# Patient Record
Sex: Male | Born: 1965 | Race: Black or African American | Hispanic: No | Marital: Single | Smoking: Never smoker
Health system: Southern US, Community
[De-identification: ages and names within clinical notes are randomized; demographics above are authoritative.]

---

## 2009-01-06 ENCOUNTER — Emergency Department (HOSPITAL_COMMUNITY): Admission: EM | Admit: 2009-01-06 | Discharge: 2009-01-06 | Payer: Self-pay | Admitting: Family Medicine

## 2009-03-29 ENCOUNTER — Ambulatory Visit: Payer: Self-pay | Admitting: Internal Medicine

## 2009-04-20 ENCOUNTER — Ambulatory Visit: Payer: Self-pay | Admitting: Internal Medicine

## 2009-04-20 ENCOUNTER — Encounter: Payer: Self-pay | Admitting: Family Medicine

## 2009-04-20 LAB — CONVERTED CEMR LAB
ALT: 26 units/L (ref 0–53)
AST: 26 units/L (ref 0–37)
Albumin: 4.2 g/dL (ref 3.5–5.2)
Alkaline Phosphatase: 83 units/L (ref 39–117)
Basophils Absolute: 0 10*3/uL (ref 0.0–0.1)
Basophils Relative: 1 % (ref 0–1)
CO2: 22 meq/L (ref 19–32)
Chloride: 105 meq/L (ref 96–112)
Eosinophils Relative: 4 % (ref 0–5)
HDL: 48 mg/dL (ref >39)
MCV: 96.3 fL (ref 78.0–100.0)
Monocytes Relative: 10 % (ref 3–12)
Neutrophils Relative %: 32 % — ABNORMAL LOW (ref 43–77)
Platelets: 275 10*3/uL (ref 150–400)
RBC: 4.37 M/uL (ref 4.22–5.81)
RDW: 12.9 % (ref 11.5–15.5)
Sodium: 138 meq/L (ref 135–145)
Total Bilirubin: 0.5 mg/dL (ref 0.3–1.2)
Total CHOL/HDL Ratio: 3.6
VLDL: 22 mg/dL (ref 0–40)
WBC: 4.4 10*3/uL (ref 4.0–10.5)

## 2009-05-18 ENCOUNTER — Ambulatory Visit (HOSPITAL_COMMUNITY): Admission: RE | Admit: 2009-05-18 | Discharge: 2009-05-18 | Payer: Self-pay | Admitting: Internal Medicine

## 2009-07-12 ENCOUNTER — Ambulatory Visit: Payer: Self-pay | Admitting: Family Medicine

## 2009-07-20 ENCOUNTER — Ambulatory Visit: Payer: Self-pay | Admitting: Sports Medicine

## 2009-07-20 DIAGNOSIS — IMO0002 Reserved for concepts with insufficient information to code with codable children: Secondary | ICD-10-CM

## 2009-07-20 DIAGNOSIS — M25569 Pain in unspecified knee: Secondary | ICD-10-CM

## 2009-07-21 ENCOUNTER — Encounter: Payer: Self-pay | Admitting: Sports Medicine

## 2009-07-25 ENCOUNTER — Encounter: Payer: Self-pay | Admitting: Sports Medicine

## 2009-08-05 ENCOUNTER — Emergency Department (HOSPITAL_COMMUNITY): Admission: EM | Admit: 2009-08-05 | Discharge: 2009-08-05 | Payer: Self-pay | Admitting: Family Medicine

## 2010-04-19 ENCOUNTER — Emergency Department (HOSPITAL_COMMUNITY): Admission: EM | Admit: 2010-04-19 | Discharge: 2010-04-20 | Payer: Self-pay | Admitting: Emergency Medicine

## 2010-08-16 ENCOUNTER — Emergency Department (HOSPITAL_COMMUNITY)
Admission: EM | Admit: 2010-08-16 | Discharge: 2010-08-16 | Payer: Self-pay | Source: Home / Self Care | Admitting: Family Medicine

## 2010-10-08 ENCOUNTER — Encounter: Payer: Self-pay | Admitting: Internal Medicine

## 2011-09-11 ENCOUNTER — Encounter: Payer: Self-pay | Admitting: *Deleted

## 2011-09-11 NOTE — Progress Notes (Signed)
[facility] faxed Revlimid refill request.  Request to MD for review.

## 2018-10-10 ENCOUNTER — Emergency Department (HOSPITAL_COMMUNITY)
Admission: EM | Admit: 2018-10-10 | Discharge: 2018-10-11 | Disposition: A | Discharge status: Home / Self Care | Payer: No Typology Code available for payment source | Attending: Emergency Medicine | Admitting: Emergency Medicine

## 2018-10-10 DIAGNOSIS — R140 Abdominal distension (gaseous): Secondary | ICD-10-CM | POA: Diagnosis not present

## 2018-10-10 DIAGNOSIS — S299XXA Unspecified injury of thorax, initial encounter: Secondary | ICD-10-CM | POA: Diagnosis present

## 2018-10-10 DIAGNOSIS — S2220XA Unspecified fracture of sternum, initial encounter for closed fracture: Secondary | ICD-10-CM | POA: Insufficient documentation

## 2018-10-10 DIAGNOSIS — Y939 Activity, unspecified: Secondary | ICD-10-CM | POA: Diagnosis not present

## 2018-10-10 DIAGNOSIS — R109 Unspecified abdominal pain: Secondary | ICD-10-CM | POA: Insufficient documentation

## 2018-10-10 DIAGNOSIS — Y999 Unspecified external cause status: Secondary | ICD-10-CM | POA: Diagnosis not present

## 2018-10-10 DIAGNOSIS — Y92410 Unspecified street and highway as the place of occurrence of the external cause: Secondary | ICD-10-CM | POA: Diagnosis not present

## 2018-10-11 ENCOUNTER — Encounter (HOSPITAL_COMMUNITY): Payer: Self-pay | Admitting: *Deleted

## 2018-10-11 ENCOUNTER — Emergency Department (HOSPITAL_COMMUNITY): Payer: No Typology Code available for payment source

## 2018-10-11 ENCOUNTER — Other Ambulatory Visit: Payer: Self-pay

## 2018-10-11 LAB — COMPREHENSIVE METABOLIC PANEL
ALT: 23 U/L (ref 0–44)
ANION GAP: 10 (ref 5–15)
AST: 31 U/L (ref 15–41)
Albumin: 3.7 g/dL (ref 3.5–5.0)
Alkaline Phosphatase: 69 U/L (ref 38–126)
BILIRUBIN TOTAL: 0.4 mg/dL (ref 0.3–1.2)
BUN: 20 mg/dL (ref 6–20)
CO2: 26 mmol/L (ref 22–32)
Calcium: 8.5 mg/dL — ABNORMAL LOW (ref 8.9–10.3)
Chloride: 104 mmol/L (ref 98–111)
Creatinine, Ser: 1.46 mg/dL — ABNORMAL HIGH (ref 0.61–1.24)
GFR calc non Af Amer: 55 mL/min — ABNORMAL LOW (ref >60)
GLUCOSE: 125 mg/dL — AB (ref 70–99)
Potassium: 4.1 mmol/L (ref 3.5–5.1)
Sodium: 140 mmol/L (ref 135–145)
TOTAL PROTEIN: 7.3 g/dL (ref 6.5–8.1)

## 2018-10-11 LAB — CBC WITH DIFFERENTIAL/PLATELET
Abs Immature Granulocytes: 0.03 10*3/uL (ref 0.00–0.07)
Basophils Absolute: 0.1 10*3/uL (ref 0.0–0.1)
Basophils Relative: 1 %
Eosinophils Absolute: 0.1 10*3/uL (ref 0.0–0.5)
Eosinophils Relative: 2 %
HEMATOCRIT: 43 % (ref 39.0–52.0)
Hemoglobin: 13.2 g/dL (ref 13.0–17.0)
Immature Granulocytes: 1 %
LYMPHS ABS: 2.8 10*3/uL (ref 0.7–4.0)
LYMPHS PCT: 43 %
MCH: 30.5 pg (ref 26.0–34.0)
MCHC: 30.7 g/dL (ref 30.0–36.0)
MCV: 99.3 fL (ref 80.0–100.0)
MONO ABS: 0.6 10*3/uL (ref 0.1–1.0)
Monocytes Relative: 10 %
Neutro Abs: 2.7 10*3/uL (ref 1.7–7.7)
Neutrophils Relative %: 43 %
Platelets: 229 10*3/uL (ref 150–400)
RBC: 4.33 MIL/uL (ref 4.22–5.81)
RDW: 11.7 % (ref 11.5–15.5)
WBC: 6.2 10*3/uL (ref 4.0–10.5)
nRBC: 0 % (ref 0.0–0.2)

## 2018-10-11 LAB — TROPONIN I: Troponin I: <0.03 ng/mL (ref <0.03)

## 2018-10-11 MED ORDER — FENTANYL CITRATE (PF) 100 MCG/2ML IJ SOLN
50.0000 ug | Freq: Once | INTRAMUSCULAR | Status: AC
  Administered 2018-10-11: 50 ug via INTRAVENOUS
  Filled 2018-10-11: qty 2

## 2018-10-11 MED ORDER — OXYCODONE-ACETAMINOPHEN 5-325 MG PO TABS: 1.0000 | ORAL_TABLET | ORAL | 0 refills | Status: AC | PRN

## 2018-10-11 MED ORDER — IOHEXOL 300 MG/ML  SOLN
125.0000 mL | Freq: Once | INTRAMUSCULAR | Status: AC | PRN
  Administered 2018-10-11: 125 mL via INTRAVENOUS

## 2018-10-11 MED ORDER — IBUPROFEN 400 MG PO TABS
600.0000 mg | ORAL_TABLET | Freq: Once | ORAL | Status: AC
  Administered 2018-10-11: 600 mg via ORAL
  Filled 2018-10-11: qty 1

## 2018-10-11 MED ORDER — OXYCODONE-ACETAMINOPHEN 5-325 MG PO TABS
2.0000 | ORAL_TABLET | Freq: Once | ORAL | Status: AC
  Administered 2018-10-11: 2 via ORAL
  Filled 2018-10-11: qty 2
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
## 2018-10-11 NOTE — ED Notes (Signed)
Patient transported to CT 

## 2018-10-11 NOTE — Discharge Instructions (Addendum)
Take the pain medication as prescribed and follow-up with your doctor.  Do not take the pain medication if you are working or driving.  Return to the ED with chest pain, shortness of breath, any other concerns.
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
## 2021-11-22 DIAGNOSIS — M25561 Pain in right knee: Secondary | ICD-10-CM | POA: Diagnosis not present

## 2021-11-22 DIAGNOSIS — X58XXXA Exposure to other specified factors, initial encounter: Secondary | ICD-10-CM | POA: Diagnosis not present

## 2021-11-22 DIAGNOSIS — S8391XA Sprain of unspecified site of right knee, initial encounter: Secondary | ICD-10-CM | POA: Diagnosis not present

## 2021-11-22 DIAGNOSIS — M25461 Effusion, right knee: Secondary | ICD-10-CM | POA: Diagnosis not present

## 2021-11-22 DIAGNOSIS — M1711 Unilateral primary osteoarthritis, right knee: Secondary | ICD-10-CM | POA: Diagnosis not present
